# Patient Record
Sex: Male | Born: 1975 | Race: White | Hispanic: No | Marital: Married
Health system: Southern US, Community
[De-identification: ages and names within clinical notes are randomized; demographics above are authoritative.]

---

## 2000-02-02 ENCOUNTER — Ambulatory Visit (HOSPITAL_COMMUNITY): Admission: RE | Admit: 2000-02-02 | Discharge: 2000-02-02 | Payer: Self-pay | Admitting: Family Medicine

## 2000-02-02 ENCOUNTER — Encounter: Payer: Self-pay | Admitting: Family Medicine

## 2008-09-20 ENCOUNTER — Emergency Department (HOSPITAL_COMMUNITY): Admission: EM | Admit: 2008-09-20 | Discharge: 2008-09-20 | Payer: Self-pay | Admitting: Emergency Medicine
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
## 2018-03-12 NOTE — Congregational Nurse Program (Signed)
Congregational Nurse Program Note  Date of Encounter: 03/12/2018  Past Medical History: No past medical history on file.  Encounter Details:  CN office visit with interpreter Diu Hartshorn assisting.  Patient needed help with Maternity bills for his wife who has Medicaid and food stamp renewal .  Brantley Fling, RN, Congregational Nurse [phone] CNP Questionnaire - 03/12/18 1948      Questionnaire   Patient Status  Refugee    Race  Asian    Location Patient Served At  Not Applicable    Insurance  Medicaid    Uninsured  Not Applicable    Food  No food insecurities    Housing/Utilities  Yes, have permanent housing    Transportation  No transportation needs    Interpersonal Safety  Yes, feel physically and emotionally safe where you currently live    Medication  No medication insecurities    Medical Provider  No    Referrals  Not Applicable    ED Visit Averted  Not Applicable    Life-Saving Intervention Made  Not Applicable

## 2019-04-01 NOTE — Congregational Nurse Program (Signed)
CN office visit with interpreter Diu Hartshorn assisting.  Patient received temperature check and screening for Covid-19 in car before entering CN office at [employer].  He responded negative to all questions about symptoms and contacts related to virus.  Temperature 97.2.  He needed assistance understanding letters regarding FNS for family  Starting 04/13/2019 they are eligible for $500+ in food stamps.  Explained to him that once he starts receiving Unemployment benefits or gets a job he must report the change to his caseworker.  Brantley Fling RN, Congregational Nurse [phone]
